# Patient Record
Sex: Female | Born: 1980 | Race: Black or African American | Hispanic: No | Marital: Single | Smoking: Current every day smoker
Health system: Southern US, Community
[De-identification: ages and names within clinical notes are randomized; demographics above are authoritative.]

## PROBLEM LIST (undated history)

## (undated) DIAGNOSIS — R87610 Atypical squamous cells of undetermined significance on cytologic smear of cervix (ASC-US): Secondary | ICD-10-CM

## (undated) DIAGNOSIS — N893 Dysplasia of vagina, unspecified: Secondary | ICD-10-CM

## (undated) DIAGNOSIS — F419 Anxiety disorder, unspecified: Secondary | ICD-10-CM

## (undated) DIAGNOSIS — E7800 Pure hypercholesterolemia, unspecified: Secondary | ICD-10-CM

## (undated) DIAGNOSIS — R009 Unspecified abnormalities of heart beat: Secondary | ICD-10-CM

## (undated) DIAGNOSIS — R51 Headache: Secondary | ICD-10-CM

## (undated) HISTORY — DX: Dysplasia of vagina, unspecified: N89.3

## (undated) HISTORY — DX: Anxiety disorder, unspecified: F41.9

## (undated) HISTORY — DX: Headache: R51

## (undated) HISTORY — DX: Unspecified abnormalities of heart beat: R00.9

## (undated) HISTORY — DX: Pure hypercholesterolemia, unspecified: E78.00

## (undated) HISTORY — DX: Atypical squamous cells of undetermined significance on cytologic smear of cervix (ASC-US): R87.610

---

## 2000-07-18 ENCOUNTER — Other Ambulatory Visit: Admission: RE | Admit: 2000-07-18 | Discharge: 2000-07-18 | Payer: Self-pay | Admitting: Obstetrics and Gynecology

## 2000-08-01 ENCOUNTER — Other Ambulatory Visit: Admission: RE | Admit: 2000-08-01 | Discharge: 2000-08-01 | Payer: Self-pay | Admitting: Obstetrics and Gynecology

## 2000-08-30 ENCOUNTER — Inpatient Hospital Stay (HOSPITAL_COMMUNITY): Admission: AD | Admit: 2000-08-30 | Discharge: 2000-08-30 | Payer: Self-pay | Admitting: Obstetrics and Gynecology

## 2001-01-31 ENCOUNTER — Inpatient Hospital Stay (HOSPITAL_COMMUNITY): Admission: AD | Admit: 2001-01-31 | Discharge: 2001-02-03 | Payer: Self-pay | Admitting: Obstetrics and Gynecology

## 2001-07-25 ENCOUNTER — Encounter: Payer: Self-pay | Admitting: Emergency Medicine

## 2001-07-25 ENCOUNTER — Emergency Department (HOSPITAL_COMMUNITY): Admission: EM | Admit: 2001-07-25 | Discharge: 2001-07-25 | Payer: Self-pay | Admitting: Emergency Medicine

## 2002-03-16 ENCOUNTER — Other Ambulatory Visit: Admission: RE | Admit: 2002-03-16 | Discharge: 2002-03-16 | Payer: Self-pay | Admitting: Obstetrics and Gynecology

## 2002-07-31 ENCOUNTER — Other Ambulatory Visit: Admission: RE | Admit: 2002-07-31 | Discharge: 2002-07-31 | Payer: Self-pay | Admitting: Obstetrics and Gynecology

## 2002-10-09 ENCOUNTER — Inpatient Hospital Stay (HOSPITAL_COMMUNITY): Admission: AD | Admit: 2002-10-09 | Discharge: 2002-10-11 | Payer: Self-pay | Admitting: Obstetrics and Gynecology

## 2002-11-16 ENCOUNTER — Other Ambulatory Visit: Admission: RE | Admit: 2002-11-16 | Discharge: 2002-11-16 | Payer: Self-pay | Admitting: Obstetrics and Gynecology

## 2003-01-03 ENCOUNTER — Inpatient Hospital Stay (HOSPITAL_COMMUNITY): Admission: AD | Admit: 2003-01-03 | Discharge: 2003-01-04 | Payer: Self-pay | Admitting: *Deleted

## 2003-02-23 ENCOUNTER — Inpatient Hospital Stay (HOSPITAL_COMMUNITY): Admission: AD | Admit: 2003-02-23 | Discharge: 2003-02-24 | Payer: Self-pay | Admitting: Obstetrics and Gynecology

## 2003-11-13 ENCOUNTER — Emergency Department (HOSPITAL_COMMUNITY): Admission: EM | Admit: 2003-11-13 | Discharge: 2003-11-13 | Payer: Self-pay | Admitting: Emergency Medicine

## 2004-03-31 ENCOUNTER — Other Ambulatory Visit: Admission: RE | Admit: 2004-03-31 | Discharge: 2004-03-31 | Payer: Self-pay | Admitting: Obstetrics and Gynecology

## 2007-11-17 ENCOUNTER — Emergency Department (HOSPITAL_COMMUNITY): Admission: EM | Admit: 2007-11-17 | Discharge: 2007-11-17 | Payer: Self-pay | Admitting: Emergency Medicine

## 2009-10-18 ENCOUNTER — Emergency Department (HOSPITAL_COMMUNITY): Admission: EM | Admit: 2009-10-18 | Discharge: 2009-10-18 | Payer: Self-pay | Admitting: Emergency Medicine

## 2010-05-19 LAB — CULTURE, ROUTINE-ABSCESS

## 2010-07-21 NOTE — Discharge Summary (Signed)
[facility]  Patient:    Denise Benson, Denise Benson Visit Number: [id number] MRN: [id number]          Service Type: OBS Location: 910A 9142 01 Attending Physician:  Shaune Spittle Dictated by:   Saverio Danker, C.N.M. Admit Date:  01/31/2001 Disc. Date: 02/03/01                             Discharge Summary  ADMISSION DIAGNOSES:          1. Intrauterine pregnancy at term.                               2. Early active labor.                               3. Negative group B streptococcus.                               4. Spontaneous rupture of the membranes.  DISCHARGE DIAGNOSES:          1. Intrauterine pregnancy at term.                               2. Early active labor.                               3. Negative group B streptococcus.                               4. Spontaneous rupture of the membranes.                               5. Prolonged second stage.                               6. Status post vacuum assisted vaginal                                  delivery.                               7. Breast-feeding.                               8. Desires oral contraceptives.  PROCEDURES THIS ADMISSION:    Vacuum assisted vaginal delivery for                               delivery of a viable female infant named                               Denise Benson who weighed 8 pounds 0 ounces and  had Apgars of 9 and 9, attended by                               Dr. Dierdre Forth on February 01, 2001.  HOSPITAL COURSE:              Ms. Denise Benson is a 30 year old, single black female, gravida 1, para 0, at 40-6/7 weeks who came complaining of uterine contractions every three to five minutes and leaking thin, green meconium-stained fluid. At that time, she was 3 to 4 cm and continuing to contract. She started having variable decelerations consistently with her contractions and at that point was about 5 cm whereupon an IUPC was  inserted for amnioinfusion. She tolerated the rest of labor well and was complete at approximately 1 a.m. At that time, she attempted to start pushing but had severe anxiety related to pushing and essentially refused to continue pushing at that time. She was then given an epidural bolus and allowed to labor down further, and subsequently, about three to four hours later, started pushing again, and offered vacuum assisted delivery, which she accepted, and underwent the same for delivery of a viable female infant named Denise Benson who weighed 8 pounds 0 ounces and had Apgars of 9 and 9, attended by Dr. Dierdre Forth on February 01, 2001. She had a second degree perineal laceration that was also repaired.  Postpartally, she has done well. She is ambulating, voiding, and eating without difficulty. She is breast-feeding, also without difficulty. She desires oral contraceptives for birth control. Her vital signs are stable and she is afebrile. She is deemed ready for discharge today.  DISCHARGE INSTRUCTIONS:       Discharge instructions are as per the [facility] handout.  DISCHARGE MEDICATIONS:        1. Motrin 600 mg p.o. q.6h. p.r.n. for pain.                               2. Tylox one to two p.o. q.4-6h. p.r.n. for                                  pain, #30, with no refills.                               3. Prenatal vitamins daily.                               4. Micronor, to start in approximately two                                  weeks, one p.o. q.d.  DISCHARGE LABORATORIES:       Her hemoglobin is 10.1, her WBC count is 17.2, and her platelets are 158,000.  DISCHARGE FOLLOWUP:           The patient is to follow up in six weeks at [facility] or p.r.n. Dictated by:   Vance Gather Duplantis, C.N.M. Attending Physician:  Shaune Spittle DD:  02/03/01 TD:  02/03/01 Job: [id number]

## 2010-07-21 NOTE — Op Note (Signed)
[facility]  Patient:    Denise Benson, Denise Benson Visit Number: [id number] MRN: [id number]          Service Type: OBS Location: 910B 9159 01 Attending Physician:  Shaune Spittle Dictated by:   Maris Berger. Pennie Rushing, M.D. Proc. Date: 02/01/01 Admit Date:  01/31/2001                             Operative Report  PREOPERATIVE DIAGNOSES:       1. Intrauterine pregnancy at term.                               2. Meconium-stained amniotic fluid.                               3. Prolonged second stage of labor.  POSTOPERATIVE DIAGNOSES:      1. Intrauterine pregnancy at term.                               2. Meconium-stained amniotic fluid.                               3. Prolonged second stage of labor.  OPERATIONS:                   1. Vacuum-assisted vaginal delivery.                               2. Repair of second-degree midline laceration.  SURGEON:                      Vanessa P. Pennie Rushing, M.D.  ANESTHESIA:                   Epidural and local for laceration repair.  ESTIMATED BLOOD LOSS:         Less than 500 cc.  COMPLICATIONS:                None.  FINDINGS:                     The patient was delivered of a female infant, whose name is [name], with Apgars of 9 and 9 at one and five minutes respectively.  Weight was pending at the time of dictation.  DESCRIPTION OF PROCEDURE:     The patient had been pushing for approximately two consecutive hours and had been able to bring the fetal vertex down to the +4 station.  There was approximately 3 cm of vertex that was showing at the perineum and was stable at that position.  The patient complained that she could push no longer.  Discussion was held with the patient concerning options including vacuum-assisted vaginal delivery.  The risks of vacuum-assisted vaginal delivery were outlined and the patient wished to proceed.  She was already in the lithotomy position.  The perineum had been  prepped.  The bladder was emptied with a straight catheter apparatus.  The Kiwi vacuum extractor was placed over the fetal vertex and pumped up to the high green region.  With a single contraction, the fetal vertex was delivered over the intact perineum  and the infant DeLee suctioned on the perineum.   A loose nuchal cord was then reduced.  The remainder of the infant was delivered with maternal expulsive efforts and gentle traction.  The cord was clamped and cut and the infant handed off to the awaiting pediatricians.  The appropriate cord blood was drawn.  The placenta was removed with gentle traction.  The cervix was noted to be intact without lacerations.  There was a small, second-degree midline perineal laceration.  This was repaired in a layered fashion with 3-0 Vicryl.  An ice pack was placed on the patients perineum and the patient placed back into the supine position.  The infant was handed to the mother for initial infant bonding.  The infant was subsequently taken to the full-term nursery.  The patient tolerated the procedure well. Dictated by:   Maris Berger. Pennie Rushing, M.D. Attending Physician:  Shaune Spittle DD:  02/01/01 TD:  02/01/01 Job: [id number]

## 2010-07-21 NOTE — H&P (Signed)
[facility]  Patient:    Denise Benson, Denise Benson Visit Number: [id number] MRN: [id number]          Service Type: OBS Location: 910B 9159 01 Attending Physician:  Shaune Spittle Dictated by:   Saverio Danker, C.N.M. Admit Date:  01/31/2001                           History and Physical  HISTORY OF PRESENT ILLNESS:   Ms. Piatt is a 30 year old single black female, gravida 1, para 0, at 40-6/7 weeks, who presents complaining of uterine contractions every 3 to 5 minutes since about 11:30 this morning.  She then reports leaking thin green fluid at approximately 1:30 p.m.  She is currently uncomfortable and requesting an epidural for pain management.  She denies any nausea, vomiting, headaches or visual disturbances.  Her pregnancy has been followed at [facility] by the M.D. service and has been at risk for:  #1 - a questionable LMP, #2 - high-grade SIL on Pap, and she is a [facility] resident.  Her group B strep is negative.  OBSTETRICAL/GYNECOLOGICAL HISTORY:  She is a primigravida with an uncertain LMP of March 29, 2000 but an [facility] of January 25, 2001 by early ultrasound. She denies any history of GYN problems prior to this pregnancy.  ALLERGIES:                    She has no known drug allergies.  GENERAL MEDICAL HISTORY:      She reports having had the usual childhood diseases.  She report no other medical problems.  FAMILY HISTORY:               Her family history is significant only for mother with hypertension and maternal grandmother with insulin-dependent diabetes.  The father of the babys family is unknown.  SOCIAL HISTORY:               She is single.  The father of the baby is not involved.  She is currently a resident at [facility] and has good support from them.  She is a Physicist, medical.  She denies any illicit drug use, alcohol or smoking with this pregnancy.  PRENATAL LABORATORY DATA:     Her blood  type is A-positive.  Her antibody screen is negative.  Sickle cell trait is negative.  Syphilis is nonreactive. Rubella is immune.  Hepatitis B surface antigen is negative.  Pap showed high-grade SIL on Jul 18, 2000 and looks like a repeat Pap on May 30th shows low-grade SIL.  GC and Chlamydia are both negative.  Maternal serum alpha-fetoprotein was within normal range and her one-hour glucola was also within normal range and her 36-week beta strep was negative.  PHYSICAL EXAMINATION:  VITAL SIGNS:                  Her vital signs are stable.  She is afebrile.  HEENT:                        Grossly within normal limits.  HEART:                        Regular rhythm and rate.  CHEST:  Clear.  BREASTS:                      Soft and nontender.  ABDOMEN:                      Gravid with uterine contractions every three minutes.  Her fetal heart rate is reactive and reassuring.  PELVIC:                       Her pelvic exam reveals a positive nitrazine, positive ferning in MAU and light meconium-stained fluid noted.  Cervix exam is 3 to 4 cm, 90%, vertex at a 0 station with forewaters which are AROMd and showed light meconium-stained fluid with bloody show.  EXTREMITIES:                  Within normal limits.  ASSESSMENT:                   1. Intrauterine pregnancy at 40-6/7 weeks.                               2. Early active labor.                               3. Negative group B streptococcus.                               4. Spontaneous rupture of the membranes.                               5. Desires epidural.  PLAN:                         Her plan is to admit to labor and delivery, to follow routine M.D. orders, to notify Dr. Maris Berger. Haygood of patients admission. Dictated by:   Vance Gather Duplantis, C.N.M. Attending Physician:  Shaune Spittle DD:  01/31/01 TD:  01/31/01 Job: [id number]

## 2010-12-06 LAB — POCT I-STAT, CHEM 8
BUN: 14
Calcium, Ion: 1.26
Chloride: 105
Creatinine, Ser: 0.9
Glucose, Bld: 84
HCT: 45
Hemoglobin: 15.3 — ABNORMAL HIGH
Potassium: 4.2
Sodium: 138
TCO2: 27

## 2010-12-06 LAB — POCT PREGNANCY, URINE: Preg Test, Ur: NEGATIVE

## 2011-02-21 ENCOUNTER — Encounter: Payer: Self-pay | Admitting: *Deleted

## 2011-02-22 ENCOUNTER — Institutional Professional Consult (permissible substitution): Payer: Self-pay | Admitting: Cardiovascular Disease

## 2011-02-22 ENCOUNTER — Encounter: Payer: Self-pay | Admitting: Cardiovascular Disease

## 2011-02-22 ENCOUNTER — Encounter: Payer: Self-pay | Admitting: *Deleted

## 2011-03-29 ENCOUNTER — Encounter: Payer: Self-pay | Admitting: Cardiovascular Disease

## 2011-03-29 ENCOUNTER — Ambulatory Visit (INDEPENDENT_AMBULATORY_CARE_PROVIDER_SITE_OTHER): Payer: BC Managed Care – PPO | Admitting: Cardiovascular Disease

## 2011-03-29 ENCOUNTER — Ambulatory Visit (HOSPITAL_COMMUNITY)
Admission: RE | Admit: 2011-03-29 | Discharge: 2011-03-29 | Disposition: A | Discharge status: Home / Self Care | Payer: BC Managed Care – PPO | Source: Ambulatory Visit | Attending: Cardiovascular Disease | Admitting: Cardiovascular Disease

## 2011-03-29 DIAGNOSIS — F17200 Nicotine dependence, unspecified, uncomplicated: Secondary | ICD-10-CM | POA: Insufficient documentation

## 2011-03-29 DIAGNOSIS — R0789 Other chest pain: Secondary | ICD-10-CM | POA: Insufficient documentation

## 2011-03-29 DIAGNOSIS — R079 Chest pain, unspecified: Secondary | ICD-10-CM

## 2011-03-29 NOTE — Assessment & Plan Note (Signed)
Suggested nicorette gum 1mg   In lieu of smoking.

## 2011-03-29 NOTE — Assessment & Plan Note (Signed)
Atypical.  F/U stress echo Baseline ECG has nonspecific ST/T wave changes

## 2011-03-29 NOTE — Patient Instructions (Signed)
Your physician recommends that you schedule a follow-up appointment with Dr. Eden Emms as needed.  Your physician has requested that you have a stress echocardiogram. For further information please visit [email or web address] Please follow instruction sheet as given.  A chest x-ray takes a picture of the organs and structures inside the chest, including the heart, lungs, and blood vessels. This test can show several things, including, whether the heart is enlarges; whether fluid is building up in the lungs; and whether pacemaker / defibrillator leads are still in place.   Your chest x-ray is ordered at [facility].

## 2011-03-29 NOTE — Progress Notes (Signed)
Patient ID: Denise Benson, female   DOB: 1980-05-08, 31 y.o.   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  No w/u.  Pain seems to be related to stress.  Not worse with exertion.  Intermitant with raadiation to neck and left arm.  No associated dyspnea, pleurisy, palpitations or cough.  No history of sarcoid or lupus.  Smokes 6-8 /day.  Counseled for less than 10 minutes on cessation and long term risk of CA. Started on [facility] but symptoms preceded this by years.  No family history of congenital cardiac issues   ROS: Denies fever, malais, weight loss, blurry vision, decreased visual acuity, cough, sputum, SOB, hemoptysis, pleuritic pain, palpitaitons, heartburn, abdominal pain, melena, lower extremity edema, claudication, or rash.  All other systems reviewed and negative   General: Affect appropriate Healthy:  appears stated age HEENT: normal Neck supple with no adenopathy JVP normal no bruits no thyromegaly Lungs clear with no wheezing and good diaphragmatic motion Heart:  S1/S2 no murmur,rub, gallop or click PMI normal Abdomen: benighn, BS positve, no tenderness, no AAA no bruit.  No HSM or HJR Distal pulses intact with no bruits No edema Neuro non-focal Skin warm and dry No muscular weakness  Medications Current Outpatient Prescriptions  Medication Sig Dispense Refill  . Norgestim-Eth Estrad Triphasic (ORTHO TRI-CYCLEN LO PO) Take 1 tablet by mouth daily.        Allergies Review of patient's allergies indicates no known allergies.  Family History: Family History  Problem Relation Age of Onset  . Hypertension Mother   . Diabetes Maternal Grandmother     Social History: History   Social History  . Marital Status: Single    Spouse Name: N/A    Number of Children: N/A  . Years of Education: N/A   Occupational History  . Not on file.   Social History Main Topics  . Smoking status: Current Everyday Smoker -- 0.5 packs/day for 8 years   Types: Cigarettes  . Smokeless tobacco: Never Used  . Alcohol Use: Not on file  . Drug Use: Not on file  . Sexually Active: Not on file   Other Topics Concern  . Not on file   Social History Narrative  . No narrative on file    Electrocardiogram:  NSR rate 73 nonspecific ST/T wave changes  Assessment and Plan

## 2011-04-04 ENCOUNTER — Ambulatory Visit (HOSPITAL_COMMUNITY): Payer: BC Managed Care – PPO | Attending: Cardiology | Admitting: Radiology

## 2011-04-04 DIAGNOSIS — F17200 Nicotine dependence, unspecified, uncomplicated: Secondary | ICD-10-CM | POA: Insufficient documentation

## 2011-04-04 DIAGNOSIS — R072 Precordial pain: Secondary | ICD-10-CM | POA: Insufficient documentation

## 2011-04-06 ENCOUNTER — Telehealth: Payer: Self-pay | Admitting: Cardiovascular Disease

## 2011-04-06 NOTE — Telephone Encounter (Signed)
New Problem   Patient would like return call regarding test results, she can be reached on hm#

## 2011-04-06 NOTE — Telephone Encounter (Signed)
PT  AWARE OF STRESS ECHO RESULTS ./CY 

## 2011-04-26 ENCOUNTER — Encounter: Payer: Self-pay | Admitting: Cardiovascular Disease

## 2011-05-07 ENCOUNTER — Telehealth: Payer: Self-pay | Admitting: Cardiovascular Disease

## 2011-05-07 NOTE — Telephone Encounter (Signed)
LOV faxed to Karen/[facility] @ [phone] 05/07/11/km

## 2011-08-12 ENCOUNTER — Emergency Department (INDEPENDENT_AMBULATORY_CARE_PROVIDER_SITE_OTHER)
Admission: EM | Admit: 2011-08-12 | Discharge: 2011-08-12 | Disposition: A | Discharge status: Home / Self Care | Payer: BC Managed Care – PPO | Source: Home / Self Care

## 2011-08-12 ENCOUNTER — Encounter (HOSPITAL_COMMUNITY): Payer: Self-pay | Admitting: *Deleted

## 2011-08-12 DIAGNOSIS — T148XXA Other injury of unspecified body region, initial encounter: Secondary | ICD-10-CM

## 2011-08-12 MED ORDER — IBUPROFEN 600 MG PO TABS: 600.0000 mg | ORAL_TABLET | Freq: Four times a day (QID) | ORAL | Status: AC | PRN

## 2011-08-12 MED ORDER — CYCLOBENZAPRINE HCL 10 MG PO TABS: 10.0000 mg | ORAL_TABLET | Freq: Every evening | ORAL | Status: AC | PRN

## 2011-08-12 MED ORDER — ACETAMINOPHEN 500 MG PO TABS: 1000.0000 mg | ORAL_TABLET | Freq: Four times a day (QID) | ORAL | Status: AC | PRN
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
## 2013-06-12 ENCOUNTER — Ambulatory Visit (INDEPENDENT_AMBULATORY_CARE_PROVIDER_SITE_OTHER): Payer: BC Managed Care – PPO | Admitting: Emergency Medicine

## 2013-06-12 ENCOUNTER — Ambulatory Visit: Payer: BC Managed Care – PPO

## 2013-06-12 VITALS — BP 120/70 | HR 80 | Temp 98.0°F | Resp 16 | Ht 70.0 in | Wt 259.0 lb

## 2013-06-12 DIAGNOSIS — M25519 Pain in unspecified shoulder: Secondary | ICD-10-CM

## 2013-06-12 NOTE — Patient Instructions (Signed)
   Shoulder Pain The shoulder is the joint that connects your arms to your body. The bones that form the shoulder joint include the upper arm bone (humerus), the shoulder blade (scapula), and the collarbone (clavicle). The top of the humerus is shaped like a ball and fits into a rather flat socket on the scapula (glenoid cavity). A combination of muscles and strong, fibrous tissues that connect muscles to bones (tendons) support your shoulder joint and hold the ball in the socket. Small, fluid-filled sacs (bursae) are located in different areas of the joint. They act as cushions between the bones and the overlying soft tissues and help reduce friction between the gliding tendons and the bone as you move your arm. Your shoulder joint allows a wide range of motion in your arm. This range of motion allows you to do things like scratch your back or throw a ball. However, this range of motion also makes your shoulder more prone to pain from overuse and injury. Causes of shoulder pain can originate from both injury and overuse and usually can be grouped in the following four categories:  Redness, swelling, and pain (inflammation) of the tendon (tendinitis) or the bursae (bursitis).  Instability, such as a dislocation of the joint.  Inflammation of the joint (arthritis).  Broken bone (fracture). HOME CARE INSTRUCTIONS   Apply ice to the sore area.  Put ice in a plastic bag.  Place a towel between your skin and the bag.  Leave the ice on for 15-20 minutes, 03-04 times per day for the first 2 days.  Stop using cold packs if they do not help with the pain.  If you have a shoulder sling or immobilizer, wear it as long as your caregiver instructs. Only remove it to shower or bathe. Move your arm as little as possible, but keep your hand moving to prevent swelling.  Squeeze a soft ball or foam pad as much as possible to help prevent swelling.  Only take over-the-counter or prescription medicines for  pain, discomfort, or fever as directed by your caregiver. SEEK MEDICAL CARE IF:   Your shoulder pain increases, or new pain develops in your arm, hand, or fingers.  Your hand or fingers become cold and numb.  Your pain is not relieved with medicines. SEEK IMMEDIATE MEDICAL CARE IF:   Your arm, hand, or fingers are numb or tingling.  Your arm, hand, or fingers are significantly swollen or turn white or blue. MAKE SURE YOU:   Understand these instructions.  Will watch your condition.  Will get help right away if you are not doing well or get worse. Document Released: 11/29/2004 Document Revised: 11/14/2011 Document Reviewed: 02/03/2011 Exit[employer] Patient Information 2014 [employer].  

## 2013-06-12 NOTE — Progress Notes (Deleted)
Subjective:     Patient ID: Denise Benson, female   DOB: 1980-09-17, 33 y.o.   MRN: [id number]  HPI 33 YO AA [facility]    Review of Systems     Objective:   Physical Exam     Assessment:     ***    Plan:     ***

## 2013-06-12 NOTE — Progress Notes (Signed)
   Subjective:    Patient ID: Denise Benson, female    DOB: 30-Mar-1980, 33 y.o.   MRN: [id number]  HPI patient is a 33 year old female who presents with right shoulder pain. Apparently yesterday she was taking trash bag out and feels like she may have strained her shoulder. Since then she has had significant anterior shoulder discomfort.    Review of Systems     Objective:   Physical Exam examination reveals tenderness over the right biceps attachment and some of the subdeltoid area. He does have pain with external rotation against resistance as well as abduction against resistance. She has no other focal signs  [facility] reading (PRIMARY) by  Dr. Cleta Albertsaub normal       Assessment & Plan:  We'll treat with ice rest advised Aleve 2 twice a day recheck 1 week if pain persists.

## 2014-01-08 ENCOUNTER — Encounter: Payer: Self-pay | Admitting: Cardiovascular Disease

## 2015-07-25 ENCOUNTER — Ambulatory Visit (HOSPITAL_COMMUNITY)
Admission: EM | Admit: 2015-07-25 | Discharge: 2015-07-25 | Disposition: A | Discharge status: Home / Self Care | Payer: BC Managed Care – PPO | Attending: Family Medicine | Admitting: Family Medicine

## 2015-07-25 ENCOUNTER — Encounter (HOSPITAL_COMMUNITY): Payer: Self-pay | Admitting: *Deleted

## 2015-07-25 ENCOUNTER — Ambulatory Visit (INDEPENDENT_AMBULATORY_CARE_PROVIDER_SITE_OTHER): Payer: BC Managed Care – PPO

## 2015-07-25 DIAGNOSIS — S8991XA Unspecified injury of right lower leg, initial encounter: Secondary | ICD-10-CM

## 2015-07-25 NOTE — Discharge Instructions (Signed)
Knee Sprain °A knee sprain is a tear in the strong bands of tissue that connect the bones (ligaments) of your knee. °HOME CARE °· Raise (elevate) your injured knee to lessen puffiness (swelling). °· To ease pain and puffiness, put ice on the injured area. °¨ Put ice in a plastic bag. °¨ Place a towel between your skin and the bag. °¨ Leave the ice on for 20 minutes, 2-3 times a day. °· Only take medicine as told by your doctor. °· Do not leave your knee unprotected until pain and stiffness go away (usually 4-6 weeks). °· If you have a cast or splint, do not get it wet. If your doctor told you to not take it off, cover it with a plastic bag when you shower or bathe. Do not swim. °· Your doctor may have you do exercises to prevent or limit permanent weakness and stiffness. °GET HELP RIGHT AWAY IF:  °· Your cast or splint becomes damaged. °· Your pain gets worse. °· You have a lot of pain, puffiness, or numbness below the cast or splint. °MAKE SURE YOU:  °· Understand these instructions. °· Will watch your condition. °· Will get help right away if you are not doing well or get worse. °  °This information is not intended to replace advice given to you by your health care provider. Make sure you discuss any questions you have with your health care provider. °  °Document Released: 02/07/2009 Document Revised: 02/24/2013 Document Reviewed: 10/28/2012 °Elsevier Interactive Patient Education ©2016 [employer] ° °

## 2015-07-25 NOTE — ED Provider Notes (Signed)
CSN: [id number]     Arrival date & time 07/25/15  1259 History   First MD Initiated Contact with Patient 07/25/15 1341     Chief Complaint  Patient presents with  . Fall  . Knee Pain  . Foot Pain   (Consider location/radiation/quality/duration/timing/severity/associated sxs/prior Treatment) HPI History obtained from patient: Location: Right knee   Context/Duration: Slipped and fell in her house yesterday   Severity: 3   Quality: Aching Timing:  Constant          Home Treatment: Icy hot, essential oils  Associated symptoms:  Pain in the right fifth toe  Family History: Negative for cancer positive for hypertension and diabetes in mother and grandmother    Past Medical History  Diagnosis Date  . Chest pain   . Headache(784.0)    History reviewed. No pertinent past surgical history. Family History  Problem Relation Age of Onset  . Hypertension Mother   . Diabetes Maternal Grandmother    Social History  Substance Use Topics  . Smoking status: Current Every Day Smoker -- 0.50 packs/day for 8 years    Types: Cigarettes  . Smokeless tobacco: Never Used  . Alcohol Use: Yes   OB History    No data available     Review of Systems  Denies: HEADACHE, NAUSEA, ABDOMINAL PAIN, CHEST PAIN, CONGESTION, DYSURIA, SHORTNESS OF BREATH  Allergies  Review of patient's allergies indicates no known allergies.  Home Medications   Prior to Admission medications   Medication Sig Start Date End Date Taking? Authorizing Provider  Norgestim-Eth Estrad Triphasic (ORTHO TRI-CYCLEN LO PO) Take 1 tablet by mouth daily.    Historical Provider, MD   Meds Ordered and Administered this Visit  Medications - No data to display  BP 116/67 mmHg  Pulse 80  Temp(Src) 97.9 F (36.6 C) (Oral)  Resp 16  SpO2 100%  LMP 06/20/2015 No data found.   Physical Exam NURSES NOTES AND VITAL SIGNS REVIEWED. CONSTITUTIONAL: Well developed, well nourished, no acute distress HEENT: normocephalic,  atraumatic EYES: Conjunctiva normal NECK:normal ROM, supple, no adenopathy PULMONARY:No respiratory distress, normal effort ABDOMINAL: Soft, ND, NT BS+, No CVAT MUSCULOSKELETAL: Normal ROM of all extremities, Right knee has some tenderness noted on the medial aspect just below the patella. There is no objectionable swelling noted. No palpable effusion. Anterior drawer sign is negative Lachman's test is negative. SKIN: warm and dry without rash PSYCHIATRIC: Mood and affect, behavior are normal  ED Course  Procedures (including critical care time)  Labs Review Labs Reviewed - No data to display  Imaging Review Dg Knee Complete 4 Views Right  07/25/2015  CLINICAL DATA:  Pt slipped yesterday and fell hurting her rt 5th toe and her rt knee, pain when walking and some swelling EXAM: RIGHT KNEE - COMPLETE 4+ VIEW COMPARISON:  None. FINDINGS: No evidence of fracture, dislocation, or joint effusion. No evidence of arthropathy or other focal bone abnormality. Soft tissues are unremarkable. IMPRESSION: Negative. Electronically Signed   By: Esperanza Heir M.D.   On: 07/25/2015 14:22   Dg Toe 5th Right  07/25/2015  CLINICAL DATA:  Slipped yesterday, fell, right fifth toe pain and swelling EXAM: RIGHT FIFTH TOE COMPARISON:  None. FINDINGS: Three views of the right fifth toe submitted. No acute fracture or subluxation. No radiopaque foreign body. IMPRESSION: Negative. Electronically Signed   By: Natasha Mead M.D.   On: 07/25/2015 14:22   I HAVE PERSONALLY  REVIEWED AND DISCUSSED RESULTS OF  X-RAYS WITH PATIENT PRIOR TO  DISCHARGE.     Visual Acuity Review  Right Eye Distance:   Left Eye Distance:   Bilateral Distance:    Right Eye Near:   Left Eye Near:    Bilateral Near:     Return to work note is provided to the patient Knee sleeve was applied by nursing staff    MDM   1. Knee injury, right, initial encounter     Patient is reassured that there are no issues that require transfer to  higher level of care at this time or additional tests. Patient is advised to continue home symptomatic treatment. Patient is advised that if there are new or worsening symptoms to attend the emergency department, contact primary care provider, or return to [facility]. Instructions of care provided discharged home in stable condition.    THIS NOTE WAS GENERATED USING A VOICE RECOGNITION SOFTWARE PROGRAM. ALL REASONABLE EFFORTS  WERE MADE TO PROOFREAD THIS DOCUMENT FOR ACCURACY.  I have verbally reviewed the discharge instructions with the patient. A printed AVS was given to the patient.  All questions were answered prior to discharge.      Tharon AquasFrank C Fredie Majano, PA 07/25/15 1455

## 2015-07-25 NOTE — ED Notes (Signed)
Patient reports slipping at the bottom of steps yesterday and falling, injuring right knee and right 5th metatarsal. Patient reports pain with movement of toe and knee. No swelling noted to sites. Patient denies hitting head. Ambulatory to exam room with no difficulty.

## 2017-04-16 IMAGING — DX DG TOE 5TH 2+V*R*
3 series · 3 of 3 positions shown · non-contrast
Comparison: None.

CLINICAL DATA: Slipped yesterday, fell, right fifth toe pain and
swelling

EXAM:
RIGHT FIFTH TOE

[toe ap]
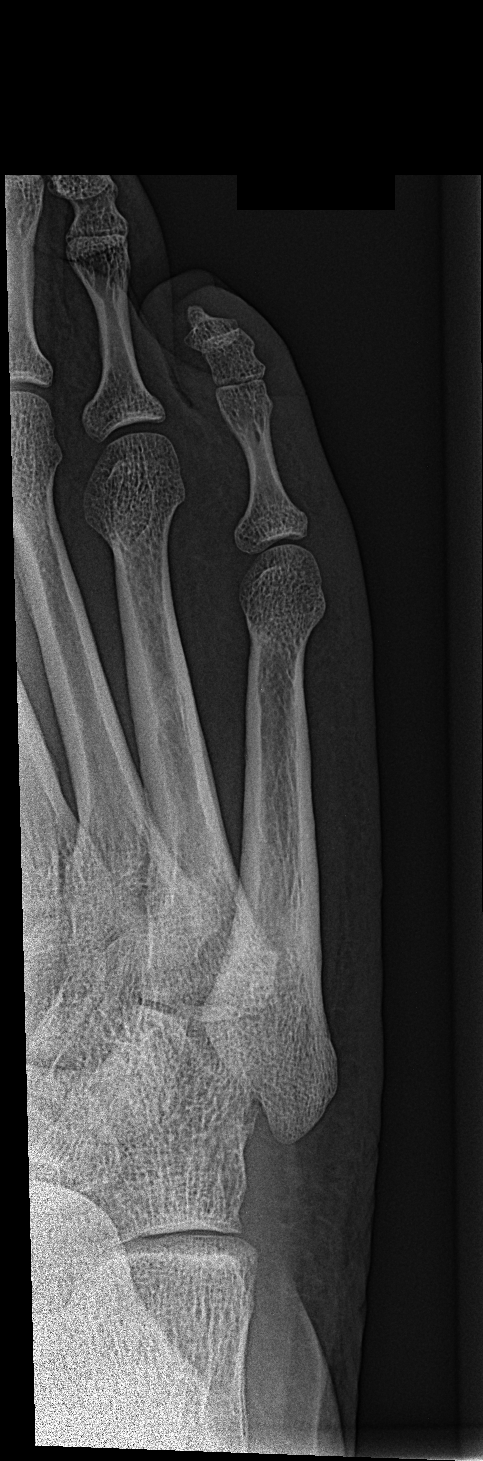

[toe obl]
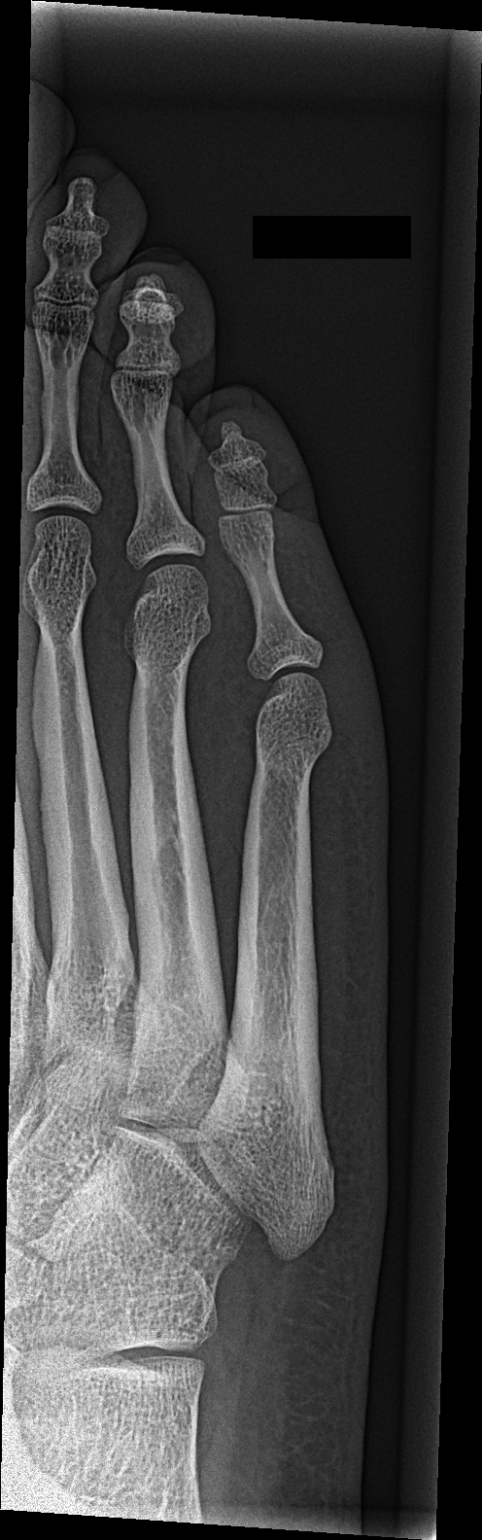

[toe lat]
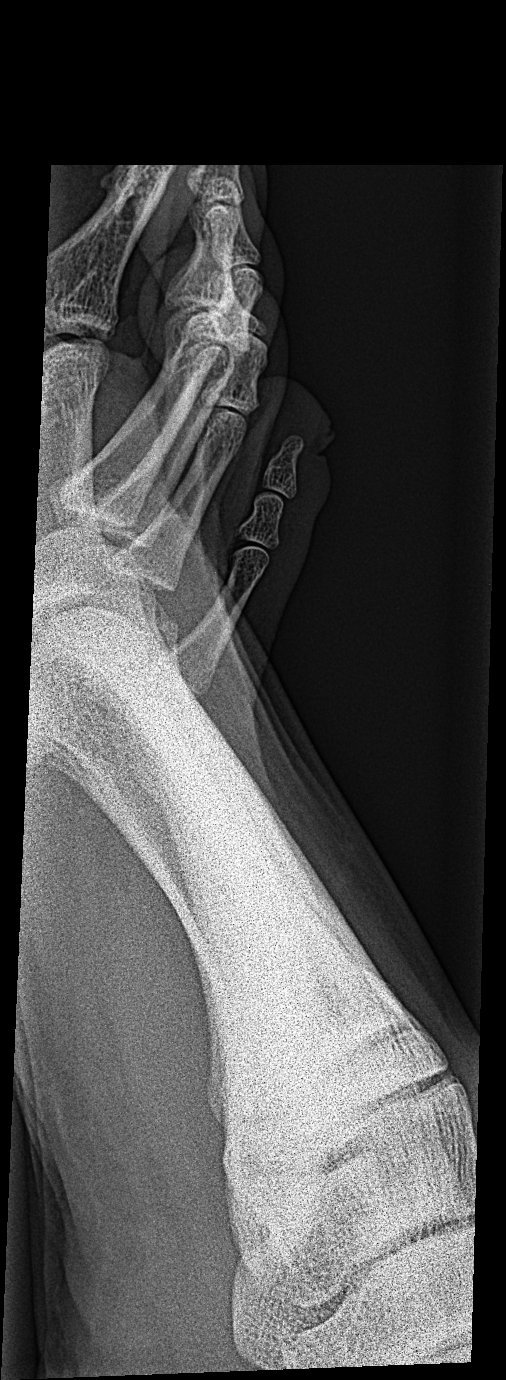

[3 of 3 positions shown; findings below may reference images not displayed]

FINDINGS: Three views of the right fifth toe submitted. No acute fracture or
subluxation. No radiopaque foreign body.
IMPRESSION: Negative.

## 2017-04-16 IMAGING — DX DG KNEE COMPLETE 4+V*R*
4 series · 4 of 4 positions shown · non-contrast
Comparison: None.

CLINICAL DATA: Pt slipped yesterday and fell hurting her rt 5th toe
and her rt knee, pain when walking and some swelling

EXAM:
RIGHT KNEE - COMPLETE 4+ VIEW

[knee ap]
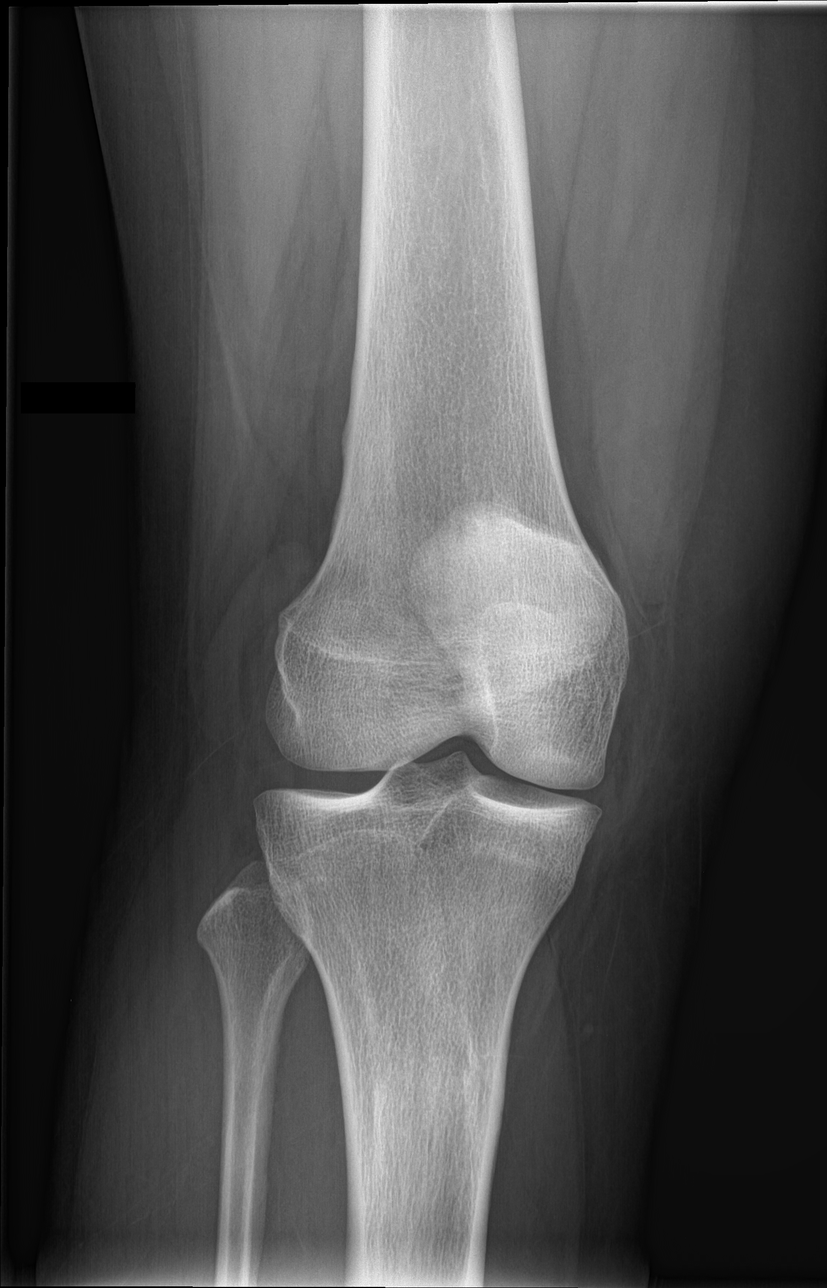

[knee obl (1 of 2)]
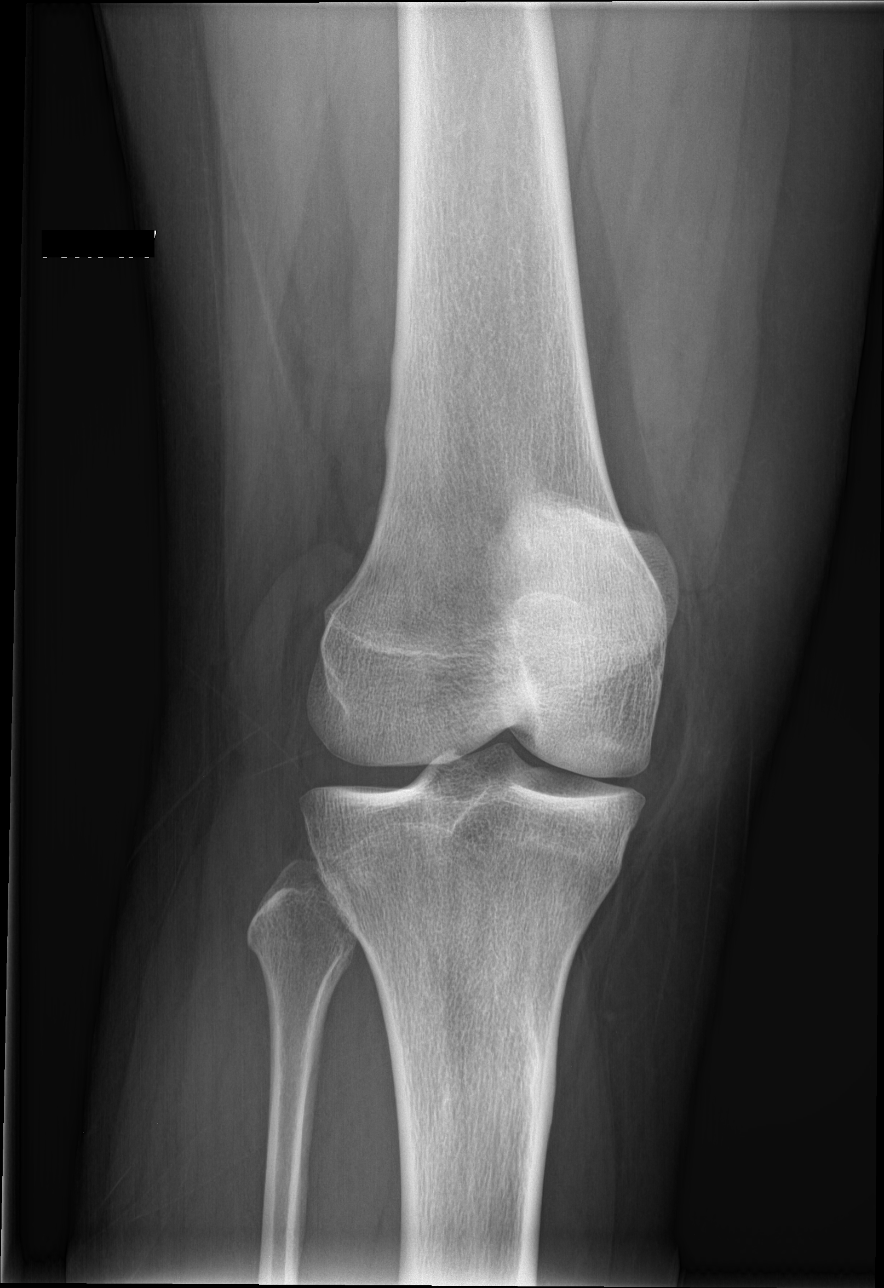

[knee obl (2 of 2)]
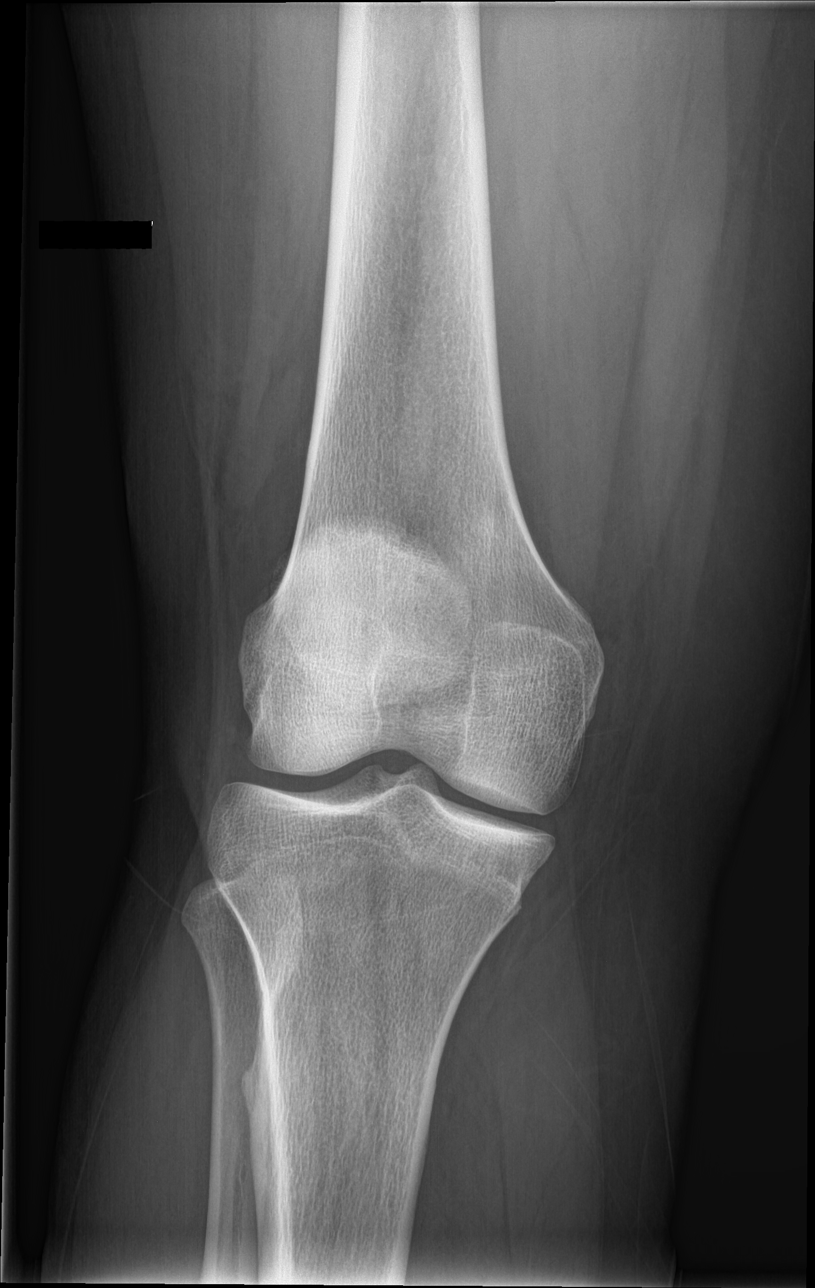

[knee lat]
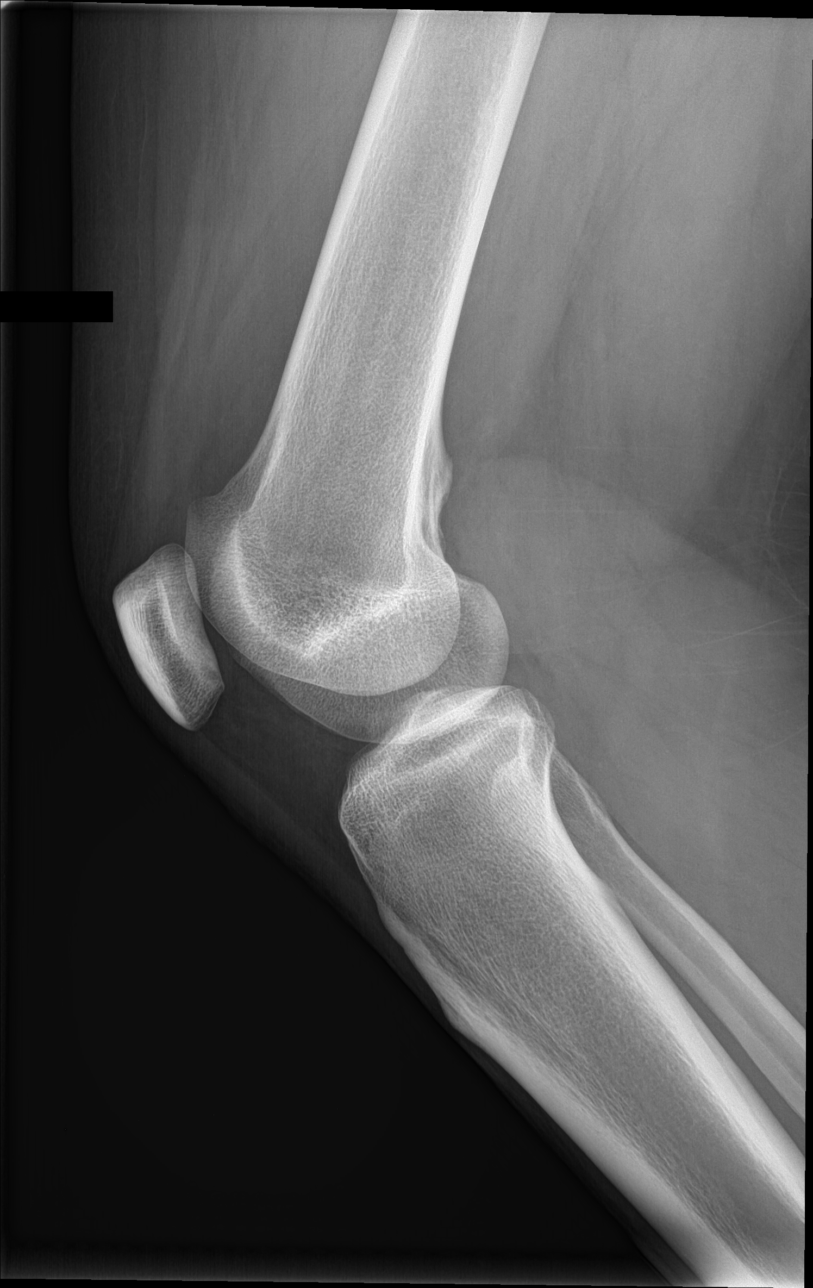

[4 of 4 positions shown; findings below may reference images not displayed]

FINDINGS: No evidence of fracture, dislocation, or joint effusion. No evidence
of arthropathy or other focal bone abnormality. Soft tissues are
unremarkable.
IMPRESSION: Negative.

## 2019-04-02 ENCOUNTER — Ambulatory Visit: Payer: BC Managed Care – PPO | Admitting: Dietician

## 2019-04-20 ENCOUNTER — Ambulatory Visit: Payer: BC Managed Care – PPO | Admitting: Registered"

## 2020-04-12 ENCOUNTER — Telehealth: Payer: Self-pay

## 2020-04-12 NOTE — Telephone Encounter (Signed)
NOTES ON FILE FROM DR JOHN MCCOMB [phone] SENT REFERRAL TO SCHEDULING 

## 2020-06-01 NOTE — Progress Notes (Incomplete)
CARDIOLOGY CONSULT NOTE       Patient ID: Denise Benson MRN: [id number] DOB/AGE: September 08, 1980 40 y.o.  Admit date: (Not on file) Referring Physician: McComb Primary Physician: Richardean Chimera, MD Primary Cardiologist: New Reason for Consultation: Elevated CRP/Cholesterol  Active Problems:   * No active hospital problems. *   HPI:  40 y.o. referred by Dr Arelia Sneddon for elevated CRP and cholesterol Former smoker quit years ago Mother had premature CAD. Her C reactive protein was 9 with upper limits normal 3 TSH normal TC 249 , LDL 170 , HDL 65 He mentions calculating a Reynolds risk score but this has a lower limit of age 84 for age range Seen by me in 2013 for long standing atypical chest pain with normal stress echo   ***  ROS All other systems reviewed and negative except as noted above  Past Medical History:  Diagnosis Date  . Chest pain   . Headache(784.0)     Family History  Problem Relation Age of Onset  . Hypertension Mother   . Diabetes Maternal Grandmother     Social History   Socioeconomic History  . Marital status: Single    Spouse name: Not on file  . Number of children: Not on file  . Years of education: Not on file  . Highest education level: Not on file  Occupational History  . Not on file  Tobacco Use  . Smoking status: Current Every Day Smoker    Packs/day: 0.50    Years: 8.00    Pack years: 4.00    Types: Cigarettes  . Smokeless tobacco: Never Used  Substance and Sexual Activity  . Alcohol use: Yes  . Drug use: Not on file  . Sexual activity: Yes    Birth control/protection: None  Other Topics Concern  . Not on file  Social History Narrative  . Not on file   Social Determinants of Health   Financial Resource Strain: Not on file  Food Insecurity: Not on file  Transportation Needs: Not on file  Physical Activity: Not on file  Stress: Not on file  Social Connections: Not on file  Intimate Partner Violence: Not on file    No past  surgical history on file.    Current Outpatient Medications:  .  Norgestim-Eth Estrad Triphasic (ORTHO TRI-CYCLEN LO PO), Take 1 tablet by mouth daily., Disp: , Rfl:     Physical Exam: There were no vitals taken for this visit. Affect appropriate Overweight black female  HEENT: normal Neck supple with no adenopathy JVP normal no bruits no thyromegaly Lungs clear with no wheezing and good diaphragmatic motion Heart:  S1/S2 no murmur, no rub, gallop or click PMI normal Abdomen: benighn, BS positve, no tenderness, no AAA no bruit.  No HSM or HJR Distal pulses intact with no bruits No edema Neuro non-focal Skin warm and dry No muscular weakness   Labs:   Lab Results  Component Value Date   HGB (H) 11/17/2007    15.3 PATIENT IDENTIFICATION ERROR. PLEASE DISREGARD RESULTS. ACCOUNT WILL BE CREDITED.   HCT  11/17/2007    45.0 PATIENT IDENTIFICATION ERROR. PLEASE DISREGARD RESULTS. ACCOUNT WILL BE CREDITED.   No results for input(s): NA, K, CL, CO2, BUN, CREATININE, CALCIUM, PROT, BILITOT, ALKPHOS, ALT, AST, GLUCOSE in the last 168 hours.  Invalid input(s): LABALBU No results found for: CKTOTAL, CKMB, CKMBINDEX, TROPONINI No results found for: CHOL No results found for: HDL No results found for: LDLCALC No results found for: TRIG No  results found for: CHOLHDL No results found for: LDLDIRECT    Radiology: No results found.  EKG: 2013 SR nonspecific ST changes    ASSESSMENT AND PLAN:   1. Elevate CRP/HLD:  ***  Signed: Charlton Haws 06/01/2020, 3:25 PM

## 2020-06-10 ENCOUNTER — Ambulatory Visit: Payer: BC Managed Care – PPO | Admitting: Cardiovascular Disease

## 2021-03-28 DIAGNOSIS — H5213 Myopia, bilateral: Secondary | ICD-10-CM | POA: Diagnosis not present

## 2021-04-17 DIAGNOSIS — H5213 Myopia, bilateral: Secondary | ICD-10-CM | POA: Diagnosis not present

## 2021-06-15 DIAGNOSIS — R1084 Generalized abdominal pain: Secondary | ICD-10-CM | POA: Diagnosis not present

## 2021-06-15 DIAGNOSIS — F419 Anxiety disorder, unspecified: Secondary | ICD-10-CM | POA: Diagnosis not present

## 2021-08-10 DIAGNOSIS — Z1329 Encounter for screening for other suspected endocrine disorder: Secondary | ICD-10-CM | POA: Diagnosis not present

## 2021-08-10 DIAGNOSIS — Z124 Encounter for screening for malignant neoplasm of cervix: Secondary | ICD-10-CM | POA: Diagnosis not present

## 2021-08-10 DIAGNOSIS — Z131 Encounter for screening for diabetes mellitus: Secondary | ICD-10-CM | POA: Diagnosis not present

## 2021-08-10 DIAGNOSIS — Z13220 Encounter for screening for lipoid disorders: Secondary | ICD-10-CM | POA: Diagnosis not present

## 2021-08-10 DIAGNOSIS — Z1321 Encounter for screening for nutritional disorder: Secondary | ICD-10-CM | POA: Diagnosis not present

## 2021-08-10 DIAGNOSIS — Z13228 Encounter for screening for other metabolic disorders: Secondary | ICD-10-CM | POA: Diagnosis not present

## 2021-08-10 DIAGNOSIS — Z01419 Encounter for gynecological examination (general) (routine) without abnormal findings: Secondary | ICD-10-CM | POA: Diagnosis not present

## 2021-08-10 DIAGNOSIS — Z6838 Body mass index (BMI) 38.0-38.9, adult: Secondary | ICD-10-CM | POA: Diagnosis not present

## 2021-08-10 DIAGNOSIS — Z1151 Encounter for screening for human papillomavirus (HPV): Secondary | ICD-10-CM | POA: Diagnosis not present

## 2021-09-06 DIAGNOSIS — Z1231 Encounter for screening mammogram for malignant neoplasm of breast: Secondary | ICD-10-CM | POA: Diagnosis not present

## 2021-09-06 DIAGNOSIS — R102 Pelvic and perineal pain: Secondary | ICD-10-CM | POA: Diagnosis not present

## 2021-09-11 ENCOUNTER — Other Ambulatory Visit: Payer: Self-pay | Admitting: Obstetrics and Gynecology

## 2021-09-11 DIAGNOSIS — R928 Other abnormal and inconclusive findings on diagnostic imaging of breast: Secondary | ICD-10-CM

## 2021-09-20 ENCOUNTER — Other Ambulatory Visit: Payer: BC Managed Care – PPO

## 2021-09-28 ENCOUNTER — Other Ambulatory Visit: Payer: BC Managed Care – PPO

## 2022-04-03 DIAGNOSIS — Z0000 Encounter for general adult medical examination without abnormal findings: Secondary | ICD-10-CM | POA: Diagnosis not present

## 2022-04-03 DIAGNOSIS — Z719 Counseling, unspecified: Secondary | ICD-10-CM | POA: Diagnosis not present

## 2022-04-03 DIAGNOSIS — E669 Obesity, unspecified: Secondary | ICD-10-CM | POA: Diagnosis not present

## 2022-10-09 DIAGNOSIS — Z6841 Body Mass Index (BMI) 40.0 and over, adult: Secondary | ICD-10-CM | POA: Diagnosis not present

## 2022-10-09 DIAGNOSIS — Z0000 Encounter for general adult medical examination without abnormal findings: Secondary | ICD-10-CM | POA: Diagnosis not present

## 2022-10-09 DIAGNOSIS — Z1231 Encounter for screening mammogram for malignant neoplasm of breast: Secondary | ICD-10-CM | POA: Diagnosis not present

## 2022-10-10 ENCOUNTER — Other Ambulatory Visit (HOSPITAL_BASED_OUTPATIENT_CLINIC_OR_DEPARTMENT_OTHER): Payer: Self-pay | Admitting: Obstetrics and Gynecology

## 2022-10-10 DIAGNOSIS — Z8249 Family history of ischemic heart disease and other diseases of the circulatory system: Secondary | ICD-10-CM

## 2022-10-15 ENCOUNTER — Other Ambulatory Visit: Payer: Self-pay | Admitting: Obstetrics and Gynecology

## 2022-10-15 DIAGNOSIS — R928 Other abnormal and inconclusive findings on diagnostic imaging of breast: Secondary | ICD-10-CM

## 2022-10-23 ENCOUNTER — Encounter (HOSPITAL_COMMUNITY): Payer: Self-pay

## 2022-10-23 ENCOUNTER — Ambulatory Visit (HOSPITAL_COMMUNITY): Payer: BC Managed Care – PPO

## 2022-10-25 ENCOUNTER — Other Ambulatory Visit: Payer: BC Managed Care – PPO

## 2023-02-05 DIAGNOSIS — N6321 Unspecified lump in the left breast, upper outer quadrant: Secondary | ICD-10-CM | POA: Diagnosis not present

## 2023-02-28 DIAGNOSIS — N6321 Unspecified lump in the left breast, upper outer quadrant: Secondary | ICD-10-CM | POA: Diagnosis not present

## 2023-02-28 DIAGNOSIS — N6012 Diffuse cystic mastopathy of left breast: Secondary | ICD-10-CM | POA: Diagnosis not present

## 2023-09-23 DIAGNOSIS — L210 Seborrhea capitis: Secondary | ICD-10-CM | POA: Diagnosis not present
